# Patient Record
Sex: Male | Born: 1970 | Race: Black or African American | Hispanic: No | Marital: Married
Health system: Southern US, Community
[De-identification: ages and names within clinical notes are randomized; demographics above are authoritative.]

---

## 2014-04-29 ENCOUNTER — Emergency Department (HOSPITAL_COMMUNITY): Payer: Self-pay

## 2014-04-29 ENCOUNTER — Encounter (HOSPITAL_COMMUNITY): Payer: Self-pay | Admitting: Emergency Medicine

## 2014-04-29 ENCOUNTER — Emergency Department (HOSPITAL_COMMUNITY)
Admission: EM | Admit: 2014-04-29 | Discharge: 2014-04-30 | Disposition: A | Discharge status: Home / Self Care | Payer: Self-pay | Attending: Emergency Medicine | Admitting: Emergency Medicine

## 2014-04-29 DIAGNOSIS — M2420 Disorder of ligament, unspecified site: Secondary | ICD-10-CM | POA: Insufficient documentation

## 2014-04-29 DIAGNOSIS — G43101 Migraine with aura, not intractable, with status migrainosus: Secondary | ICD-10-CM

## 2014-04-29 DIAGNOSIS — R209 Unspecified disturbances of skin sensation: Secondary | ICD-10-CM | POA: Insufficient documentation

## 2014-04-29 DIAGNOSIS — M629 Disorder of muscle, unspecified: Secondary | ICD-10-CM | POA: Insufficient documentation

## 2014-04-29 DIAGNOSIS — G43111 Migraine with aura, intractable, with status migrainosus: Secondary | ICD-10-CM

## 2014-04-29 DIAGNOSIS — G43109 Migraine with aura, not intractable, without status migrainosus: Secondary | ICD-10-CM | POA: Insufficient documentation

## 2014-04-29 LAB — CBG MONITORING, ED: GLUCOSE-CAPILLARY: 100 mg/dL — AB (ref 70–99)

## 2014-04-29 LAB — CBC WITH DIFFERENTIAL/PLATELET
Basophils Absolute: 0 10*3/uL (ref 0.0–0.1)
Basophils Relative: 0 % (ref 0–1)
EOS ABS: 0.1 10*3/uL (ref 0.0–0.7)
Eosinophils Relative: 2 % (ref 0–5)
HCT: 44.6 % (ref 39.0–52.0)
HEMOGLOBIN: 15.1 g/dL (ref 13.0–17.0)
Lymphocytes Relative: 42 % (ref 12–46)
Lymphs Abs: 2.3 10*3/uL (ref 0.7–4.0)
MCH: 29.2 pg (ref 26.0–34.0)
MCHC: 33.9 g/dL (ref 30.0–36.0)
MCV: 86.3 fL (ref 78.0–100.0)
MONOS PCT: 10 % (ref 3–12)
Monocytes Absolute: 0.5 10*3/uL (ref 0.1–1.0)
Neutro Abs: 2.4 10*3/uL (ref 1.7–7.7)
Neutrophils Relative %: 46 % (ref 43–77)
PLATELETS: 288 10*3/uL (ref 150–400)
RBC: 5.17 MIL/uL (ref 4.22–5.81)
RDW: 14.4 % (ref 11.5–15.5)
WBC: 5.3 10*3/uL (ref 4.0–10.5)

## 2014-04-29 LAB — I-STAT CHEM 8, ED
BUN: 15 mg/dL (ref 6–23)
CREATININE: 1 mg/dL (ref 0.50–1.35)
Calcium, Ion: 1.21 mmol/L (ref 1.12–1.23)
Chloride: 104 mEq/L (ref 96–112)
GLUCOSE: 105 mg/dL — AB (ref 70–99)
HCT: 48 % (ref 39.0–52.0)
HEMOGLOBIN: 16.3 g/dL (ref 13.0–17.0)
Potassium: 3.7 mEq/L (ref 3.7–5.3)
Sodium: 140 mEq/L (ref 137–147)
TCO2: 22 mmol/L (ref 0–100)

## 2014-04-29 LAB — COMPREHENSIVE METABOLIC PANEL
ALBUMIN: 3.9 g/dL (ref 3.5–5.2)
ALT: 27 U/L (ref 0–53)
ANION GAP: 13 (ref 5–15)
AST: 26 U/L (ref 0–37)
Alkaline Phosphatase: 53 U/L (ref 39–117)
BUN: 15 mg/dL (ref 6–23)
CO2: 23 mEq/L (ref 19–32)
CREATININE: 0.84 mg/dL (ref 0.50–1.35)
Calcium: 9.2 mg/dL (ref 8.4–10.5)
Chloride: 102 mEq/L (ref 96–112)
GFR calc Af Amer: >90 mL/min (ref >90)
GFR calc non Af Amer: >90 mL/min (ref >90)
Glucose, Bld: 104 mg/dL — ABNORMAL HIGH (ref 70–99)
Potassium: 3.9 mEq/L (ref 3.7–5.3)
Sodium: 138 mEq/L (ref 137–147)
TOTAL PROTEIN: 7.6 g/dL (ref 6.0–8.3)
Total Bilirubin: 0.2 mg/dL — ABNORMAL LOW (ref 0.3–1.2)

## 2014-04-29 LAB — APTT: aPTT: 33 seconds (ref 24–37)

## 2014-04-29 LAB — I-STAT TROPONIN, ED: TROPONIN I, POC: 0 ng/mL (ref 0.00–0.08)

## 2014-04-29 LAB — PROTIME-INR
INR: 0.94 (ref 0.00–1.49)
PROTHROMBIN TIME: 12.6 s (ref 11.6–15.2)

## 2014-04-29 MED ORDER — PROCHLORPERAZINE EDISYLATE 5 MG/ML IJ SOLN
10.0000 mg | Freq: Once | INTRAMUSCULAR | Status: AC
  Administered 2014-04-29: 10 mg via INTRAVENOUS
  Filled 2014-04-29: qty 2

## 2014-04-29 MED ORDER — DIPHENHYDRAMINE HCL 50 MG/ML IJ SOLN
12.5000 mg | Freq: Once | INTRAMUSCULAR | Status: AC
  Administered 2014-04-29: 12.5 mg via INTRAVENOUS
  Filled 2014-04-29: qty 1

## 2014-04-29 NOTE — Consult Note (Signed)
Neurology Consultation Reason for Consult: Left-sided numbness Referring Physician: Darlyn ChamberZackowski, S  CC: Left-sided numbness  History is obtained from: Patient  HPI: Johnny Taylor is a 43 y.o. male with a history of migraines who presents with left-sided numbness started around 9 PM. He states that started his face and there is some tingling, this then migrated into his arm and in his leg.  Of note, he has a migraine which is a typical migraine for him and has been present for 3 days. He does get visual aura with his migraines from time to time, but has not had sensory aura previously.   LKW: 9 PM tpa given?: no, not stroke    ROS: A 14 point ROS was performed and is negative except as noted in the HPI.    Past medical history: Migraines  Family History: No hx similar  Social History: Tob: denies  Exam: Current vital signs: BP 111/80  Pulse 72  Temp(Src) 97.5 F (36.4 C) (Oral)  Resp 16  Wt 116.756 kg (257 lb 6.4 oz)  SpO2 72% Vital signs in last 24 hours: Temp:  [97.5 F (36.4 C)] 97.5 F (36.4 C) (07/23 2130) Pulse Rate:  [72] 72 (07/23 2130) Resp:  [16] 16 (07/23 2130) BP: (111)/(80) 111/80 mmHg (07/23 2130) SpO2:  [72 %] 72 % (07/23 2130) Weight:  [116.756 kg (257 lb 6.4 oz)] 116.756 kg (257 lb 6.4 oz) (07/23 2151)  General: In bed, NAD CV: Regular rate and rhythm Mental Status: Patient is awake, alert, oriented to person, place, month, year, and situation. Immediate and remote memory are intact. Patient is able to give a clear and coherent history. No signs of aphasia or neglect Cranial Nerves: II: Visual Fields are full. Pupils are equal, round, and reactive to light.  Discs are difficult to visualize. III,IV, VI: EOMI without ptosis or diploplia.  V: Facial sensation is decreased on the left to pin VII: Facial movement is symmetric.  VIII: hearing is intact to voice X: Uvula elevates symmetrically XI: Shoulder shrug is symmetric. XII: tongue is  midline without atrophy or fasciculations.  Motor: Tone is normal. Bulk is normal. 5/5 strength was present in all four extremities.  Sensory: Sensation is decreased to pinprick in the left arm, but intact in the left leg. Deep Tendon Reflexes: 2+ and symmetric in the biceps and patellae.  Plantars: Toes are downgoing bilaterally.  Cerebellar: FNF and HKS are intact bilaterally Gait: Not assessed due to acute nature of evaluation and multiple medical monitors in ED setting.          I have reviewed labs in epic and the results pertinent to this consultation are: cmp - unremarkable  I have reviewed the images obtained:MR brain - no strkoe  Impression: 43 yo M with left sided tingling that spread down his left arm in the setting of typical migraine that has been persistent for three days.   Recommendations: 1) Would try compazine/benadryl.  2) could use toradol and magnesium if no improvement with first therapy.  3) Could use gabapentin 300mg  TID x 5 days to calm down status migrainosus as outpatient if continues to have symptoms.  4) He may benefit from outpatient neurology referral for frequent migraines.   Ritta SlotMcNeill Shantanu Strauch, MD [facility] [phone]  If 7pm- 7am, please page neurology on call as listed in [facility].

## 2014-04-29 NOTE — ED Notes (Signed)
Code stroke cancelled at 2219.

## 2014-04-29 NOTE — ED Notes (Signed)
This RN escorted the pt back from MRI. While in the MRI machine, pt called out stating that he felt like the back of his head was burning. MRI was immediately stopped. Amada JupiterKirkpatrick, MD is aware.

## 2014-04-29 NOTE — Progress Notes (Signed)
Code Stroke called on 43 yo male c/o head ache for three days, rapid onset left facial numbness, and tingling migrating to left arm and leg [facility] completed and reviewed per Dr. Amada JupiterKirkpatrick, negative. NIHSS 1, pt with left facial numbness only at time of assessment. CBG 100. MRI completed with/without contrast. Most of scan completed when pt c/o severe pain/heat in back of head. Dr. Amada JupiterKirkpatrick notified. Stroke canceled at 2219 per Dr. Amada JupiterKirkpatrick. Pt with history of migraines, per Neurology symptoms due to migraine at this time. Pt updated on plan of care, MD to order pain meds for headache. Pt to be discharged tonight.

## 2014-04-29 NOTE — ED Notes (Signed)
Pt. reports headache x 2 days , sudden onset left facial numbness, left shoulder and left arm numbness while at church approx. 9 pm this evening , evaluated by Dr. Jodelle GrossZakowski at triage - advised RN to call code stroke.

## 2014-04-30 NOTE — ED Provider Notes (Signed)
CSN: [id number]     Arrival date & time 04/29/14  2123 History   First MD Initiated Contact with Patient 04/29/14 2135     Chief Complaint  Patient presents with  . Extremity Weakness    An emergency department physician performed an initial assessment on this suspected stroke patient at 2130. (Consider location/radiation/quality/duration/timing/severity/associated sxs/prior Treatment) Patient is a 43 y.o. male presenting with extremity weakness. The history is provided by the patient.  Extremity Weakness This is a new problem. Associated symptoms include headaches. Pertinent negatives include no abdominal pain and no shortness of breath.   patient presented to triage. Patient had a headache for 2 days. This evening about 30 minutes prior arrival sudden onset of left facial numbness left shoulder and left arm numbness while at church. Patient seen by me and triaged and not coated stroke called. Patient felt as if he was having some difficulty walking due to heaviness in the left leg. No weakness to his left arm.  History reviewed. No pertinent past medical history. No past surgical history on file. No family history on file. History  Substance Use Topics  . Smoking status: Not on file  . Smokeless tobacco: Not on file  . Alcohol Use: Not on file    Review of Systems  Constitutional: Negative for fever.  HENT: Negative for congestion.   Eyes: Negative for visual disturbance.  Respiratory: Negative for shortness of breath.   Gastrointestinal: Negative for nausea, vomiting and abdominal pain.  Genitourinary: Negative for dysuria.  Musculoskeletal: Positive for extremity weakness. Negative for back pain and neck pain.  Skin: Negative for rash.  Neurological: Positive for weakness, numbness and headaches.  Hematological: Does not bruise/bleed easily.  Psychiatric/Behavioral: Negative for confusion.      Allergies  Review of patient's allergies indicates no known  allergies.  Home Medications   Prior to Admission medications   Medication Sig Start Date End Date Taking? Authorizing Provider  Ibuprofen-Diphenhydramine Cit (ADVIL PM PO) Take 2 tablets by mouth at bedtime as needed (sleep/pain).   Yes Historical Provider, MD   BP 113/79  Pulse 62  Temp(Src) 97.7 F (36.5 C) (Oral)  Resp 19  Wt 257 lb 6.4 oz (116.756 kg)  SpO2 96% Physical Exam  Nursing note and vitals reviewed. Constitutional: He is oriented to person, place, and time. He appears well-developed and well-nourished. No distress.  HENT:  Head: Normocephalic and atraumatic.  Mouth/Throat: Oropharynx is clear and moist.  Eyes: Conjunctivae and EOM are normal. Pupils are equal, round, and reactive to light.  Neck: Normal range of motion.  Cardiovascular: Normal rate, regular rhythm and normal heart sounds.   No murmur heard. Pulmonary/Chest: Effort normal and breath sounds normal. No respiratory distress.  Abdominal: Soft. Bowel sounds are normal. There is no tenderness.  Musculoskeletal: Normal range of motion.  Neurological: He is alert and oriented to person, place, and time. No cranial nerve deficit. He exhibits normal muscle tone. Coordination normal.  Patient was subtle weakness heaviness feeling in the left leg. Numbness to the left arm numbness to the left face. No gross motor deficits.  Skin: Skin is warm. No rash noted.    ED Course  Procedures (including critical care time) Labs Review Labs Reviewed  COMPREHENSIVE METABOLIC PANEL - Abnormal; Notable for the following:    Glucose, Bld 104 (*)    Total Bilirubin 0.2 (*)    All other components within normal limits  CBG MONITORING, ED - Abnormal; Notable for the following:  Glucose-Capillary 100 (*)    All other components within normal limits  I-STAT CHEM 8, ED - Abnormal; Notable for the following:    Glucose, Bld 105 (*)    All other components within normal limits  PROTIME-INR  APTT  CBC WITH DIFFERENTIAL   CBC  DIFFERENTIAL  CBC  DIFFERENTIAL  I-STAT TROPOININ, ED  CBG MONITORING, ED  I-STAT TROPOININ, ED   Results for orders placed during the hospital encounter of 04/29/14  PROTIME-INR      Result Value Ref Range   Prothrombin Time 12.6  11.6 - 15.2 seconds   INR 0.94  0.00 - 1.49  APTT      Result Value Ref Range   aPTT 33  24 - 37 seconds  COMPREHENSIVE METABOLIC PANEL      Result Value Ref Range   Sodium 138  137 - 147 mEq/L   Potassium 3.9  3.7 - 5.3 mEq/L   Chloride 102  96 - 112 mEq/L   CO2 23  19 - 32 mEq/L   Glucose, Bld 104 (*) 70 - 99 mg/dL   BUN 15  6 - 23 mg/dL   Creatinine, Ser 1.61  0.50 - 1.35 mg/dL   Calcium 9.2  8.4 - 09.6 mg/dL   Total Protein 7.6  6.0 - 8.3 g/dL   Albumin 3.9  3.5 - 5.2 g/dL   AST 26  0 - 37 U/L   ALT 27  0 - 53 U/L   Alkaline Phosphatase 53  39 - 117 U/L   Total Bilirubin 0.2 (*) 0.3 - 1.2 mg/dL   GFR calc non Af Amer >90  >90 mL/min   GFR calc Af Amer >90  >90 mL/min   Anion gap 13  5 - 15  CBC WITH DIFFERENTIAL      Result Value Ref Range   WBC 5.3  4.0 - 10.5 K/uL   RBC 5.17  4.22 - 5.81 MIL/uL   Hemoglobin 15.1  13.0 - 17.0 g/dL   HCT 04.5  40.9 - 81.1 %   MCV 86.3  78.0 - 100.0 fL   MCH 29.2  26.0 - 34.0 pg   MCHC 33.9  30.0 - 36.0 g/dL   RDW 91.4  78.2 - 95.6 %   Platelets 288  150 - 400 K/uL   Neutrophils Relative % 46  43 - 77 %   Neutro Abs 2.4  1.7 - 7.7 K/uL   Lymphocytes Relative 42  12 - 46 %   Lymphs Abs 2.3  0.7 - 4.0 K/uL   Monocytes Relative 10  3 - 12 %   Monocytes Absolute 0.5  0.1 - 1.0 K/uL   Eosinophils Relative 2  0 - 5 %   Eosinophils Absolute 0.1  0.0 - 0.7 K/uL   Basophils Relative 0  0 - 1 %   Basophils Absolute 0.0  0.0 - 0.1 K/uL  CBG MONITORING, ED      Result Value Ref Range   Glucose-Capillary 100 (*) 70 - 99 mg/dL   Comment 1 Notify RN     Comment 2 Documented in Chart    I-STAT TROPOININ, ED      Result Value Ref Range   Troponin i, poc 0.00  0.00 - 0.08 ng/mL   Comment 3            I-STAT CHEM 8, ED      Result Value Ref Range   Sodium 140  137 - 147 mEq/L  Potassium 3.7  3.7 - 5.3 mEq/L   Chloride 104  96 - 112 mEq/L   BUN 15  6 - 23 mg/dL   Creatinine, Ser 1.61  0.50 - 1.35 mg/dL   Glucose, Bld 096 (*) 70 - 99 mg/dL   Calcium, Ion 0.45  4.09 - 1.23 mmol/L   TCO2 22  0 - 100 mmol/L   Hemoglobin 16.3  13.0 - 17.0 g/dL   HCT 81.1  91.4 - 78.2 %     Imaging Review Ct Head (brain) Wo Contrast  04/29/2014   CLINICAL DATA:  Two day history of headache with sudden onset of left facial numbness and left upper extremity numbness  EXAM: CT HEAD WITHOUT CONTRAST  TECHNIQUE: Contiguous axial images were obtained from the base of the skull through the vertex without intravenous contrast.  COMPARISON:  None.  FINDINGS: The ventricles are normal in size and position. There is no intracranial hemorrhage nor intracranial mass effect. No acute ischemic changes are demonstrated. The cerebellum and brainstem are normal.  The observed paranasal sinuses and mastoid air cells are clear. There is no acute skull fracture.  IMPRESSION: There is no acute intracranial abnormality.  Code stroke: These results were called by telephone at the time of interpretation on 04/29/2014 at 9:54 pm to Dr. Thereasa Distance who verbally acknowledged these results.   Electronically Signed   By: David  [place]   On: 04/29/2014 21:55   Mr Brain Wo Contrast  04/29/2014   CLINICAL DATA:  Two days headache, acute onset left facial numbness, left arm weakness.  EXAM: MRI HEAD WITHOUT CONTRAST  TECHNIQUE: Multiplanar, multiecho pulse sequences (axial and coronal diffusion, axial T2 FLAIR and axial T2) of the brain and surrounding structures were obtained without intravenous contrast. Patient was unable to tolerate further imaging.  COMPARISON:  CT head April 29, 2014  FINDINGS: No reduced diffusion to suggest acute ischemia nor hyperacute demyelination. No midline shift or mass effect. The ventricles and sulci are normal  for patient's age. No abnormal parenchymal signal.  No abnormal extra-axial fluid collections. Normal major intracranial vascular flow voids seen at the skull base. No cerebellar tonsillar ectopia.  Ocular globes and orbital contents are nonsuspicious though, limited assessment on these axial sequences. Visualized paranasal sinuses and mastoid air cells are well aerated.  IMPRESSION: o acute intracranial process ; normal limited noncontrast MRI of the brain.  Findings discussed with and reconfirmed by [facility] on7/23/2015at10:35 pm.   Electronically Signed   By: Awilda Metro   On: 04/29/2014 22:37     EKG Interpretation   Date/Time:  Thursday April 29 2014 21:50:46 EDT Ventricular Rate:  66 PR Interval:  159 QRS Duration: 94 QT Interval:  399 QTC Calculation: 418 R Axis:   55 Text Interpretation:  Sinus rhythm No previous ECGs available Confirmed by  Erandi Lemma  MD, Aniket Paye ([id number]) on 04/29/2014 10:16:35 PM      CRITICAL CARE Performed by: Vanetta Mulders Total critical care time: 30 Critical care time was exclusive of separately billable procedures and treating other patients. Critical care was necessary to treat or prevent imminent or life-threatening deterioration. Critical care was time spent personally by me on the following activities: development of treatment plan with patient and/or surrogate as well as nursing, discussions with consultants, evaluation of patient's response to treatment, examination of patient, obtaining history from patient or surrogate, ordering and performing treatments and interventions, ordering and review of laboratory studies, ordering and review of radiographic studies, pulse oximetry and re-evaluation of  patient's condition.     MDM   Final diagnoses:  Migraine with aura and with status migrainosus, not intractable    Patient came in as a code stroke. Patient does seem by me in triage. Stroke team saw the patient agreed with a code  stroke all. Patient's head CT was negative. MRI was done to differentiate between a complicated migraine and not stroke symptoms. An MRI was negative patient treated as a migraine all symptoms have resolved. Patient we discharged home.    Vanetta MuldersScott Dayane Hillenburg, MD 04/30/14 0030

## 2014-04-30 NOTE — Discharge Instructions (Signed)
Migraine Headache A migraine headache is very bad, throbbing pain on one or both sides of your head. Talk to your doctor about what things may bring on (trigger) your migraine headaches. HOME CARE  Only take medicines as told by your doctor.  Lie down in a dark, quiet room when you have a migraine.  Keep a journal to find out if certain things bring on migraine headaches. For example, write down:  What you eat and drink.  How much sleep you get.  Any change to your diet or medicines.  Lessen how much alcohol you drink.  Quit smoking if you smoke.  Get enough sleep.  Lessen any stress in your life.  Keep lights dim if bright lights bother you or make your migraines worse. GET HELP RIGHT AWAY IF:   Your migraine becomes really bad.  You have a fever.  You have a stiff neck.  You have trouble seeing.  Your muscles are weak, or you lose muscle control.  You lose your balance or have trouble walking.  You feel like you will pass out (faint), or you pass out.  You have really bad symptoms that are different than your first symptoms. MAKE SURE YOU:   Understand these instructions.  Will watch your condition.  Will get help right away if you are not doing well or get worse. Document Released: 07/03/2008 Document Revised: 12/17/2011 Document Reviewed: 06/01/2013 [facility] Patient Information 2015 [place], [place]. This information is not intended to replace advice given to you by your health care provider. Make sure you discuss any questions you have with your health care provider.    symptoms consistent with a complicated migraine. Rest as much as possible. Return for any new or worse symptoms. MRI showed no evidence of a stroke. Blood pressure has improved.

## 2015-01-23 IMAGING — CT CT HEAD W/O CM
1 series · 16 of 30 positions shown, 20 images · non-contrast
Comparison: None.

CLINICAL DATA: Two day history of headache with sudden onset of
left facial numbness and left upper extremity numbness

EXAM:
CT HEAD WITHOUT CONTRAST
TECHNIQUE: Contiguous axial images were obtained from the base of the skull
through the vertex without intravenous contrast.

[Series 2: head 5.0 h30s · axial · 0.47mm/px · z∈[-143,+2]mm · 16 of 33 slices shown, 20 images]
[im 2/33  brain]
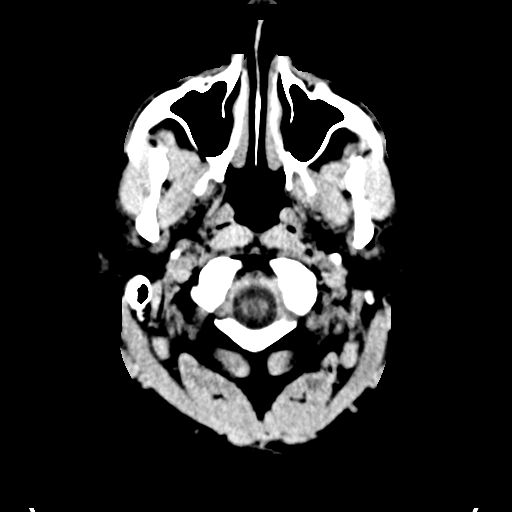
[im 2/33  bone]
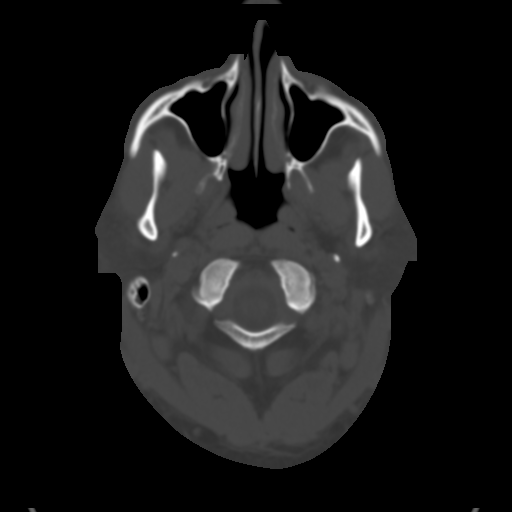
[im 4/33  brain]
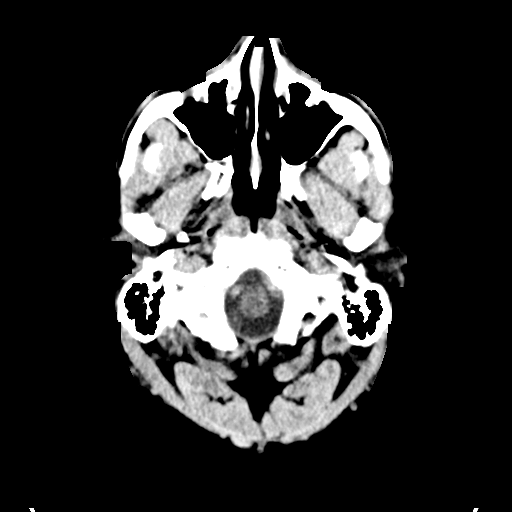
[im 6/33  brain]
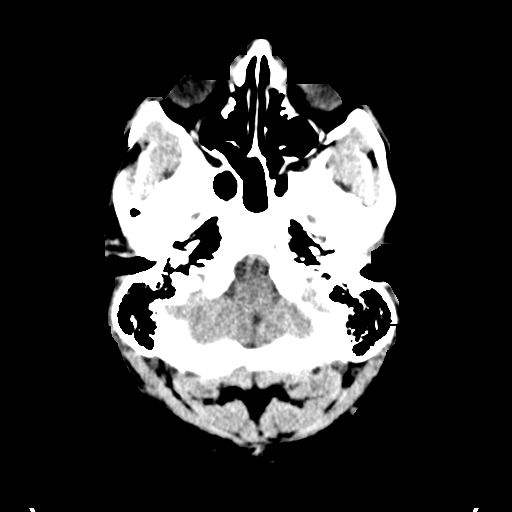
[im 8/33  brain]
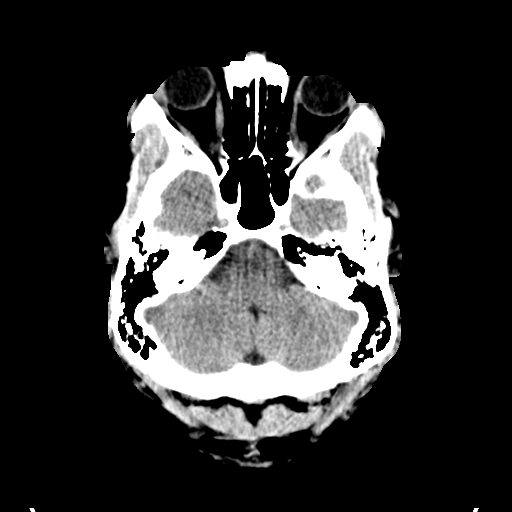
[im 9/33  brain]
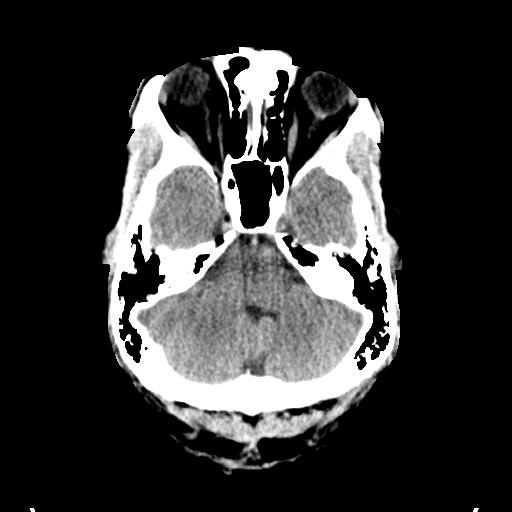
[im 9/33  bone]
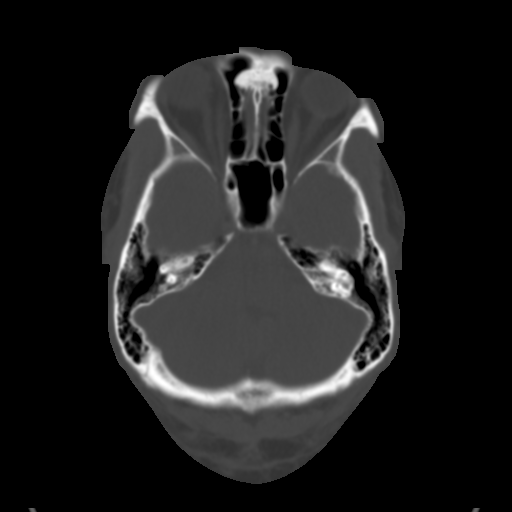
[im 12/33  brain]
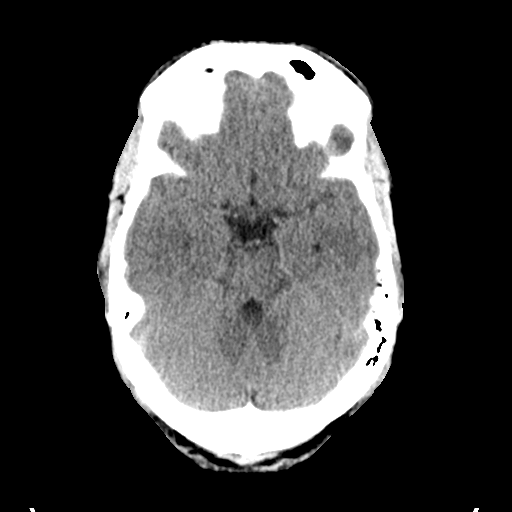
[im 14/33  brain]
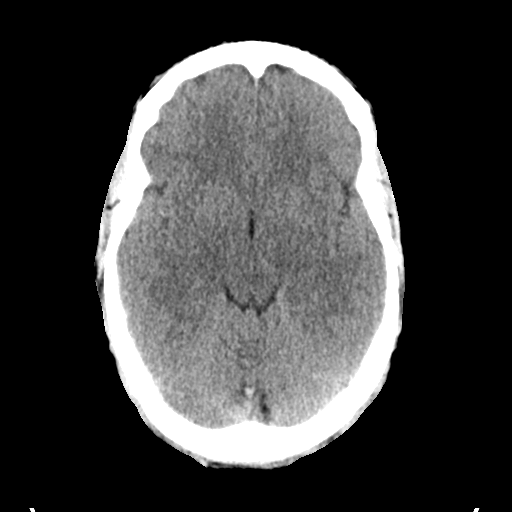
[im 16/33  brain]
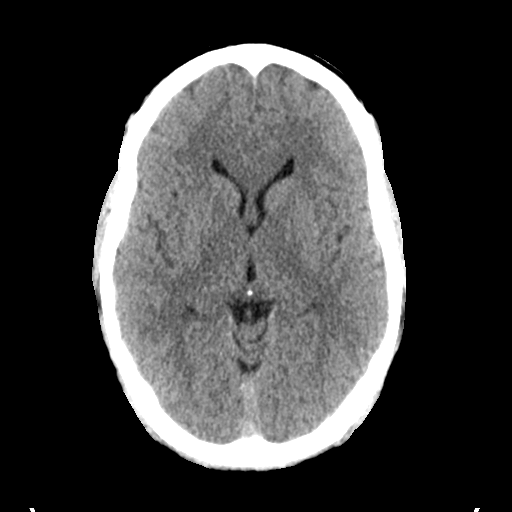
[im 17/33  brain]
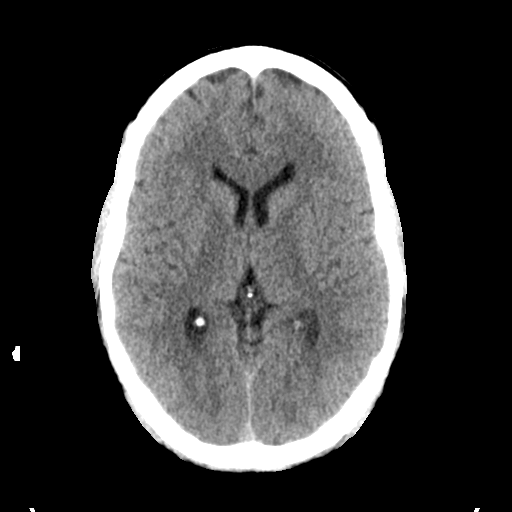
[im 17/33  bone]
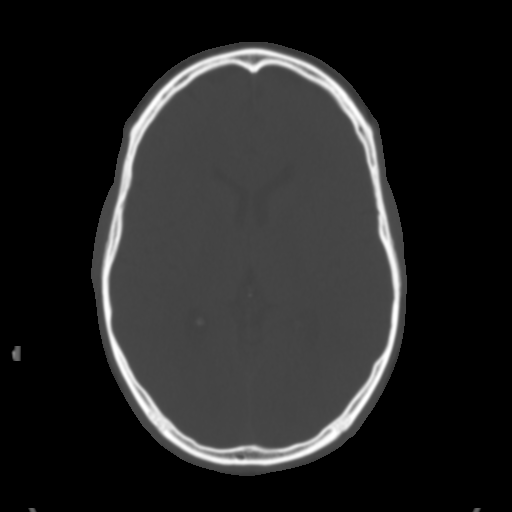
[im 19/33  brain]
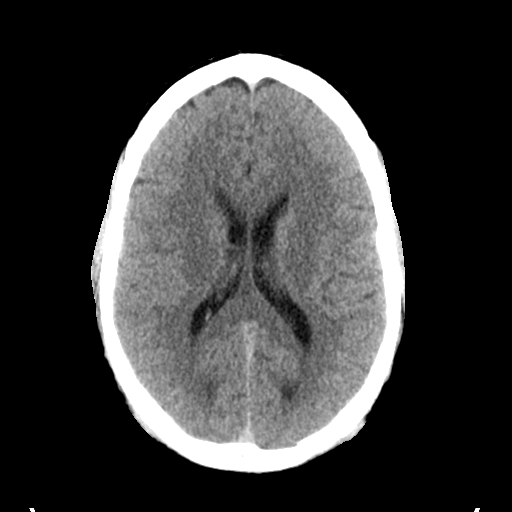
[im 21/33  brain]
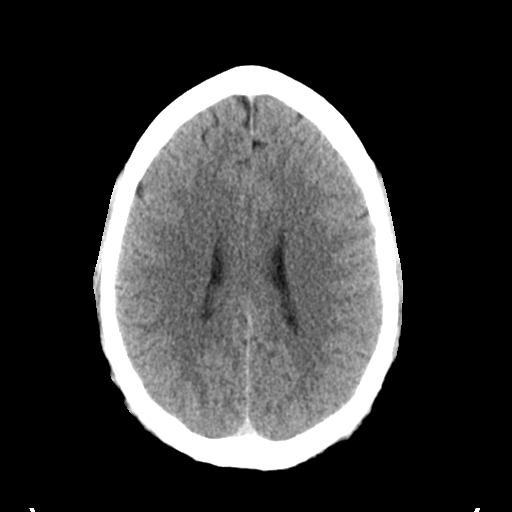
[im 24/33  brain]
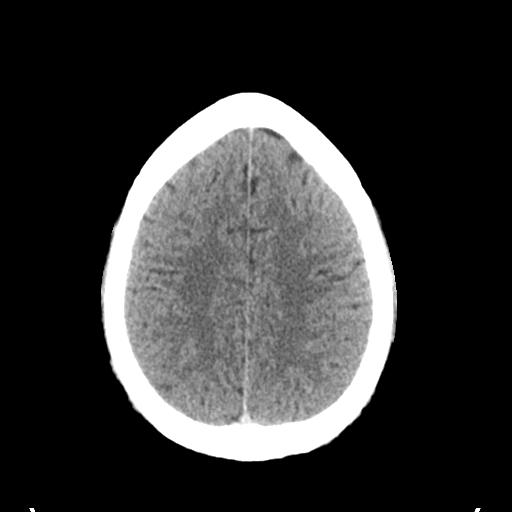
[im 25/33  brain]
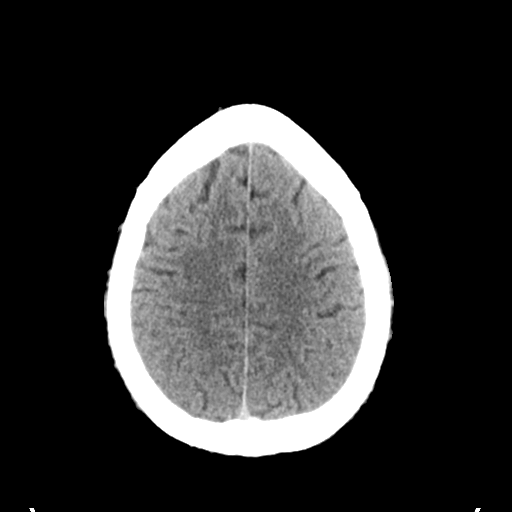
[im 25/33  bone]
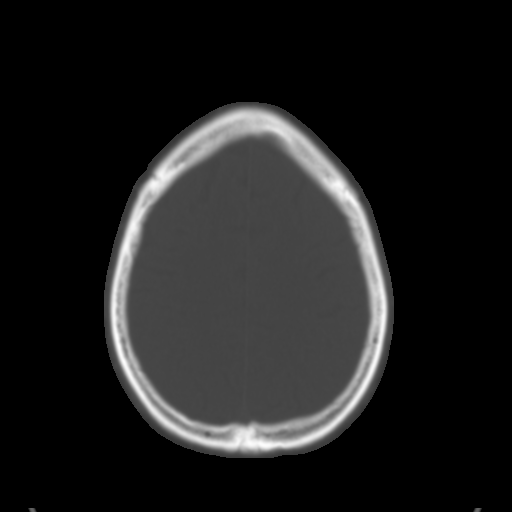
[im 27/33  brain]
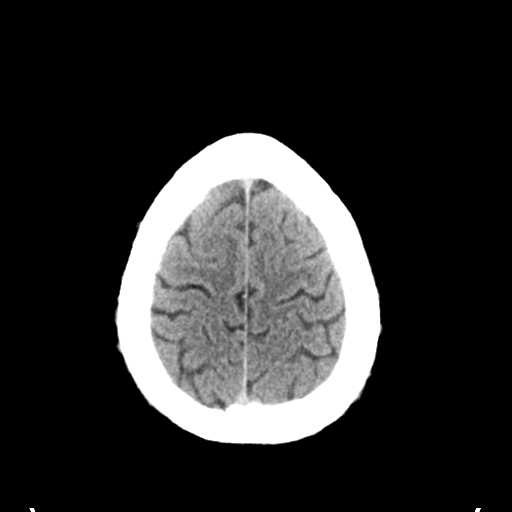
[im 29/33  brain]
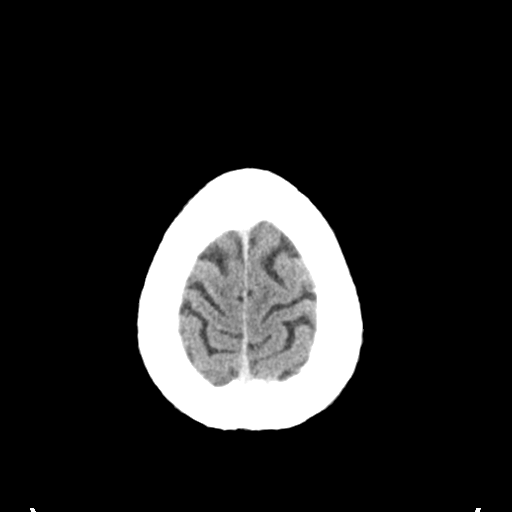
[im 31/33  brain]
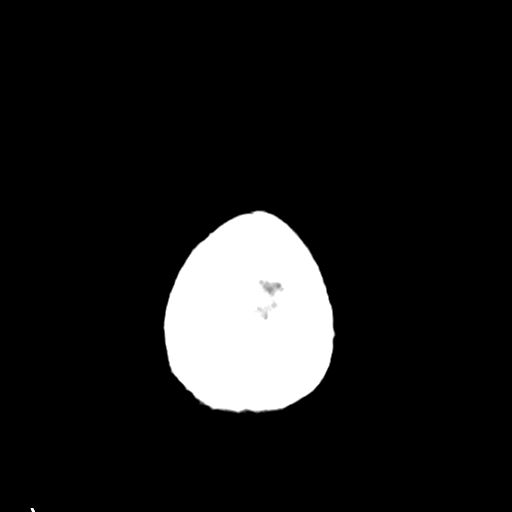

[16 of 30 positions shown; findings below may reference images not displayed]

FINDINGS: The ventricles are normal in size and position. There is no
intracranial hemorrhage nor intracranial mass effect. No acute
ischemic changes are demonstrated. The cerebellum and brainstem are
normal.

The observed paranasal sinuses and mastoid air cells are clear.
There is no acute skull fracture.
IMPRESSION: There is no acute intracranial abnormality.

Code stroke: These results were called by telephone at the time of
interpretation on 04/29/2014 at [DATE] to Dr. Avalon Jumper who
verbally acknowledged these results.

## 2022-07-06 ENCOUNTER — Telehealth (HOSPITAL_COMMUNITY): Payer: Self-pay

## 2022-07-06 NOTE — Telephone Encounter (Signed)
Referral faxed to [facility] cardiac rehab 07/06/22
